# Patient Record
Sex: Female | Born: 2000 | Race: White | Hispanic: No | Marital: Single | Smoking: Never smoker
Health system: Southern US, Community
[De-identification: ages and names within clinical notes are randomized; demographics above are authoritative.]

## PROBLEM LIST (undated history)

## (undated) VITALS — BP 110/65 | HR 134 | Temp 97.7°F | Resp 16 | Ht 64.57 in | Wt 219.4 lb

## (undated) DIAGNOSIS — E669 Obesity, unspecified: Secondary | ICD-10-CM

## (undated) DIAGNOSIS — Z9049 Acquired absence of other specified parts of digestive tract: Secondary | ICD-10-CM

## (undated) DIAGNOSIS — T7840XA Allergy, unspecified, initial encounter: Secondary | ICD-10-CM

## (undated) DIAGNOSIS — F419 Anxiety disorder, unspecified: Secondary | ICD-10-CM

## (undated) DIAGNOSIS — J45909 Unspecified asthma, uncomplicated: Secondary | ICD-10-CM

## (undated) DIAGNOSIS — R519 Headache, unspecified: Secondary | ICD-10-CM

## (undated) DIAGNOSIS — R51 Headache: Secondary | ICD-10-CM

## (undated) DIAGNOSIS — F909 Attention-deficit hyperactivity disorder, unspecified type: Secondary | ICD-10-CM

## (undated) HISTORY — DX: Acquired absence of other specified parts of digestive tract: Z90.49

## (undated) HISTORY — PX: TONSILLECTOMY: SUR1361

## (undated) HISTORY — PX: ADENOIDECTOMY: SUR15

---
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
## 2019-01-07 ENCOUNTER — Emergency Department (HOSPITAL_BASED_OUTPATIENT_CLINIC_OR_DEPARTMENT_OTHER): Payer: BLUE CROSS/BLUE SHIELD

## 2019-01-07 ENCOUNTER — Encounter (HOSPITAL_BASED_OUTPATIENT_CLINIC_OR_DEPARTMENT_OTHER): Payer: Self-pay | Admitting: Adult Health

## 2019-01-07 ENCOUNTER — Other Ambulatory Visit: Payer: Self-pay

## 2019-01-07 ENCOUNTER — Emergency Department (HOSPITAL_BASED_OUTPATIENT_CLINIC_OR_DEPARTMENT_OTHER)
Admission: EM | Admit: 2019-01-07 | Discharge: 2019-01-08 | Disposition: A | Discharge status: Home / Self Care | Payer: BLUE CROSS/BLUE SHIELD | Attending: Emergency Medicine | Admitting: Emergency Medicine

## 2019-01-07 DIAGNOSIS — R110 Nausea: Secondary | ICD-10-CM | POA: Diagnosis not present

## 2019-01-07 DIAGNOSIS — J45901 Unspecified asthma with (acute) exacerbation: Secondary | ICD-10-CM | POA: Insufficient documentation

## 2019-01-07 DIAGNOSIS — R0602 Shortness of breath: Secondary | ICD-10-CM | POA: Diagnosis present

## 2019-01-07 DIAGNOSIS — R0789 Other chest pain: Secondary | ICD-10-CM | POA: Insufficient documentation

## 2019-01-07 LAB — RAPID URINE DRUG SCREEN, HOSP PERFORMED
Amphetamines: NOT DETECTED
Barbiturates: NOT DETECTED
Benzodiazepines: POSITIVE — AB
Cocaine: NOT DETECTED
Opiates: NOT DETECTED
Tetrahydrocannabinol: NOT DETECTED

## 2019-01-07 LAB — BASIC METABOLIC PANEL
Anion gap: 11 (ref 5–15)
BUN: 10 mg/dL (ref 6–20)
CO2: 23 mmol/L (ref 22–32)
Calcium: 10 mg/dL (ref 8.9–10.3)
Chloride: 105 mmol/L (ref 98–111)
Creatinine, Ser: 0.66 mg/dL (ref 0.44–1.00)
GFR calc Af Amer: >60 mL/min (ref >60)
GFR calc non Af Amer: >60 mL/min (ref >60)
Glucose, Bld: 106 mg/dL — ABNORMAL HIGH (ref 70–99)
Potassium: 4 mmol/L (ref 3.5–5.1)
Sodium: 139 mmol/L (ref 135–145)

## 2019-01-07 LAB — CBC
HCT: 44.2 % (ref 36.0–46.0)
Hemoglobin: 13.9 g/dL (ref 12.0–15.0)
MCH: 28 pg (ref 26.0–34.0)
MCHC: 31.4 g/dL (ref 30.0–36.0)
MCV: 89.1 fL (ref 80.0–100.0)
Platelets: 407 10*3/uL — ABNORMAL HIGH (ref 150–400)
RBC: 4.96 MIL/uL (ref 3.87–5.11)
RDW: 13.6 % (ref 11.5–15.5)
WBC: 12.3 10*3/uL — ABNORMAL HIGH (ref 4.0–10.5)
nRBC: 0 % (ref 0.0–0.2)

## 2019-01-07 LAB — TROPONIN I: Troponin I: <0.03 ng/mL (ref <0.03)

## 2019-01-07 LAB — D-DIMER, QUANTITATIVE: D-Dimer, Quant: <0.27 ug/mL-FEU (ref 0.00–0.50)

## 2019-01-07 LAB — PREGNANCY, URINE: Preg Test, Ur: NEGATIVE

## 2019-01-07 MED ORDER — FENTANYL CITRATE (PF) 100 MCG/2ML IJ SOLN
100.0000 ug | Freq: Once | INTRAMUSCULAR | Status: AC
  Administered 2019-01-07: 100 ug via INTRAVENOUS
  Filled 2019-01-07: qty 2

## 2019-01-07 MED ORDER — SODIUM CHLORIDE 0.9% FLUSH
3.0000 mL | Freq: Once | INTRAVENOUS | Status: DC
  Filled 2019-01-07: qty 3

## 2019-01-07 MED ORDER — KETOROLAC TROMETHAMINE 30 MG/ML IJ SOLN
30.0000 mg | Freq: Once | INTRAMUSCULAR | Status: AC
  Administered 2019-01-07: 30 mg via INTRAVENOUS
  Filled 2019-01-07: qty 1

## 2019-01-07 MED ORDER — METHYLPREDNISOLONE SODIUM SUCC 125 MG IJ SOLR
125.0000 mg | Freq: Once | INTRAMUSCULAR | Status: AC
  Administered 2019-01-07: 125 mg via INTRAVENOUS
  Filled 2019-01-07: qty 2

## 2019-01-07 MED ORDER — ONDANSETRON HCL 4 MG/2ML IJ SOLN
4.0000 mg | Freq: Once | INTRAMUSCULAR | Status: AC
  Administered 2019-01-07: 4 mg via INTRAVENOUS
  Filled 2019-01-07: qty 2

## 2019-01-07 MED ORDER — SODIUM CHLORIDE 0.9 % IV BOLUS
1000.0000 mL | Freq: Once | INTRAVENOUS | Status: AC
  Administered 2019-01-07: 21:00:00 1000 mL via INTRAVENOUS

## 2019-01-07 NOTE — ED Provider Notes (Signed)
[facility] EMERGENCY DEPARTMENT Provider Note   CSN: [id number] Arrival date & time: 01/07/19  1906    History   Chief Complaint Chief Complaint  Patient presents with   Chest Pain    HPI Tami Hunter is a 18 y.o. female past medical history of ADHD, anxiety, asthma, obesity who presents for evaluation of 1 week of progressively worsening shortness of breath and 3 to 4 days of chest pain.  Patient reports that she started noticing some progressively worsening shortness of breath states she felt like she was wheezing.  She called her pulmonologist who she follows for asthma who wrote her a course of prednisone.  Patient reports he has been taking the prednisone but with no improvement in symptoms.  She states she feels like she is still wheezing.  She notes that her shortness of breath has gotten worse with exertion.  She notes that today, while walking around [place], she had to stop because she was getting short of breath.  She has been using her inhalers with minimal improvement.  Patient also reports she has had 3 to 4 days of intermittent chest pain.  She describes it as a "sharp tingling type pain" in the midsternal region.  She states that it is slightly worsened with deep inspiration with exertion.  She has not had any associated diaphoresis but has had some nausea.  Patient states she is also been coughing more significantly.  She states she has not noted any production of cough and has not had any hemoptysis.  Patient states that she has not noted any fever.  She has not had any sick contacts or known COVID-19 exposure.  She denies any travel.  Patient states she is not a current smoker.  She denies any cocaine use.  Patient states that her mom had a history of blood clots but was unsure what the reason was.  Patient denies any personal history of blood clots. She denies any OCP use, recent immobilization, prior history of DVT/PE, recent surgery, leg swelling, or long travel.   Patient reports her grandmother had a heart attack at age 84.  No history of high blood pressure or diabetes.  She states she does not eat energy drinks but she has been having more caffeine.     The history is provided by the patient.    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   Family history of Crohn's disease 10/03/2015   Chronic constipation 10/03/2015   Dysmenorrhea 07/05/2015   S/P cholecystectomy 06/02/2015   Obesity 06/02/2015   History of cholecystectomy 06/02/2015   Insomnia 05/31/2015   Bipolar I disorder with depression (HCC) 10/26/2014   Bipolar 1 disorder, depressed, partial remission (HCC) 06/05/2014   Chronic post-traumatic stress disorder (PTSD) 06/05/2014   Attention deficit hyperactivity disorder (ADHD), combined type, moderate 06/05/2014   Adjustment disorder with mixed anxiety and depressed mood 01/29/2013    Past Surgical History:  Procedure Laterality Date   ADENOIDECTOMY  TONSILLECTOMY       OB History   No obstetric history on file.      Home Medications    Prior to Admission medications   Medication Sig Start Date End Date Taking? Authorizing Provider  albuterol (PROVENTIL HFA;VENTOLIN HFA) 108 (90 BASE) MCG/ACT inhaler Inhale 2 puffs into the lungs every 6 (six) hours as needed for wheezing or shortness of breath. 10/05/14   Chauncey Mann, MD  aspirin-acetaminophen-caffeine (EXCEDRIN MIGRAINE) [phone] MG per tablet Take 2 tablets by mouth every 6 (six) hours as needed  for headache or migraine (mild pain). Patient not taking: Reported on 05/31/2015 10/05/14   Chauncey Mann, MD  buPROPion (WELLBUTRIN XL) 300 MG 24 hr tablet Take 1 tablet (300 mg total) by mouth daily. 01/31/16   Court Joy, PA-C  cloNIDine (CATAPRES) 0.1 MG tablet Take 1-2 tablets at bed time 01/23/16   Court Joy, PA-C  dapsone 25 MG tablet TAKE 1 TABLET BY MOUTH EVERY DAY FOR 2 WEEKS,THEN INCREASE TO 4 TABLETS DAILY 02/21/16   [provider]  dicyclomine (BENTYL) 20 MG tablet Take 20 mg by mouth. 05/02/15 01/31/16  [provider]  diphenoxylate-atropine (LOMOTIL) 2.5-0.025 MG tablet TAKE 1 TO 2 TABLETS BY MOUTH EVERY 4 TO 6 HOURS AS NEEDED CRAMPY ABDOMINAL PAIN OR DIARRHEA 05/08/15   [provider]  doxycycline (VIBRA-TABS) 100 MG tablet Take 1 tablet (100 mg total) by mouth 2 (two) times daily. Patient not taking: Reported on 01/31/2016 10/05/14   Chauncey Mann, MD  FLUoxetine (PROZAC) 20 MG capsule Take 1 capsule (20 mg total) by mouth every morning. 01/23/16   Court Joy, PA-C  fluticasone Aleda Grana) 50 MCG/ACT nasal spray  10/19/14   [provider]  lamoTRIgine (LAMICTAL) 100 MG tablet  02/20/16   [provider]  levonorgestrel-ethinyl estradiol (SRONYX) 0.1-20 MG-MCG tablet Take by mouth. 07/05/15 07/04/16  [provider]  lithium carbonate (LITHOBID) 300 MG CR tablet TAKE ONE EACH MORNING AND WITH DINNER, TAKE TWO AT BEDTIME. 01/31/16   Court Joy, PA-C  Meclizine HCl 25 MG CHEW 1-2 tabs TID prn vertigo 01/03/16   Court Joy, PA-C  metroNIDAZOLE (FLAGYL) 500 MG tablet Reported on 01/03/2016 06/28/15   [provider]  NIFEdipine (PROCARDIA-XL/ADALAT-CC/NIFEDICAL-XL) 30 MG 24 hr tablet TAKE 1 TABLET (30 MG TOTAL) BY MOUTH DAILY. 12/31/15   [provider]  omeprazole (PRILOSEC) 40 MG capsule  02/20/16   [provider]  ondansetron (ZOFRAN) 8 MG tablet TAKE 1 TABLET (8 MG TOTAL) BY MOUTH EVERY  8 (EIGHT) HOURS AS NEEDED FOR UP TO 30 DAYS FOR NAUSEA. 03/13/16   [provider]  traZODone (DESYREL) 100 MG tablet Take 1 tablet (100 mg total) by mouth at bedtime. 01/31/16   Court Joy, PA-C  tretinoin (RETIN-A) 0.025 % cream  08/24/14   [provider]  Vitamin D, Ergocalciferol, (DRISDOL) 50000 UNITS CAPS capsule Reported on 01/03/2016 10/19/14   [provider]    Family History Family History  Problem Relation Age of Onset   Bipolar disorder Father    Bipolar disorder Maternal Aunt    Alcohol abuse Paternal Grandmother    Drug abuse Paternal Grandmother    Crohn's disease Mother    Colitis Mother     Social History Social History   Tobacco Use   Smoking status: Never Smoker   Smokeless tobacco: Never Used  Substance Use Topics   Alcohol use: No    Alcohol/week:  0.0 standard drinks   Drug use: No     Allergies   Clindamycin; Penicillins; Cephalexin; Other; Codeine; Iodinated diagnostic agents; and Ioxaglate   Review of Systems Review of Systems  Constitutional: Negative for fever.  Respiratory: Positive for shortness of breath. Negative for cough.   Cardiovascular: Positive for chest pain. Negative for leg swelling.  Gastrointestinal: Negative for abdominal pain, nausea and vomiting.  Genitourinary: Negative for dysuria and hematuria.  Neurological: Negative for headaches.  All other systems reviewed and are negative.    Physical Exam Updated Vital Signs BP 140/84 (BP Location: Left Arm)    Pulse 91    Temp 98.2 F (36.8 C)    Resp (!) 22    Ht  (1.676 m)    Wt 135.6 kg    SpO2 99%    BMI 48.26 kg/m   Physical Exam Vitals signs and nursing note reviewed.  Constitutional:      Appearance: Normal appearance. She is well-developed.     Comments: Appears anxious  HENT:     Head: Normocephalic and atraumatic.  Eyes:     General: Lids are normal.     Conjunctiva/sclera: Conjunctivae normal.     Pupils: Pupils  are equal, round, and reactive to light.  Neck:     Musculoskeletal: Full passive range of motion without pain.  Cardiovascular:     Rate and Rhythm: Normal rate and regular rhythm.     Pulses: Normal pulses.          Radial pulses are 2+ on the right side and 2+ on the left side.       Dorsalis pedis pulses are 2+ on the right side and 2+ on the left side.     Heart sounds: Normal heart sounds. No murmur. No friction rub. No gallop.   Pulmonary:     Effort: Pulmonary effort is normal.     Breath sounds: Normal breath sounds.     Comments: Lungs clear to auscultation bilaterally.  Symmetric chest rise.  No wheezing, rales, rhonchi.  Able to speak in full sentences without any difficulty. Chest:     Comments: Pain reproduced by palpation of midsternal chest. Abdominal:     Palpations: Abdomen is soft. Abdomen is not rigid.     Tenderness: There is no abdominal tenderness. There is no guarding.     Comments: Lungs clear to auscultation bilaterally.  Symmetric chest rise.  No wheezing, rales, rhonchi.  Musculoskeletal: Normal range of motion.     Comments: BLE are symmetric in appearance without any overlying warmth, erythema, edema.   Skin:    General: Skin is warm and dry.     Capillary Refill: Capillary refill takes less than 2 seconds.  Neurological:     Mental Status: She is alert and oriented to person, place, and time.  Psychiatric:        Speech: Speech normal.      ED Treatments / Results  Labs (all labs ordered are listed, but only abnormal results are displayed) Labs Reviewed  BASIC METABOLIC PANEL - Abnormal; Notable for the following components:      Result Value   Glucose, Bld 106 (*)    All other components within normal limits  CBC - Abnormal; Notable for the following components:   WBC 12.3 (*)    Platelets 407 (*)    All other components within normal limits  RAPID URINE DRUG SCREEN, HOSP PERFORMED - Abnormal; Notable for the following components:  Benzodiazepines POSITIVE (*)    All other components within normal limits  TROPONIN I  PREGNANCY, URINE  D-DIMER, QUANTITATIVE (NOT AT [facility])  TROPONIN I    EKG EKG Interpretation  Date/Time:  Friday Jan 07 2019 19:15:44 EDT Ventricular Rate:  116 PR Interval:  140 QRS Duration: 90 QT Interval:  308 QTC Calculation: 428 R Axis:   103 Text Interpretation:  Sinus tachycardia Rightward axis Cannot rule out Inferior infarct , age undetermined Abnormal ECG inferior T wave inversions and percordial T wave flattening new from previous Confirmed by Frederick Peers [phone]) on 01/07/2019 7:24:03 PM Also confirmed by Frederick Peers [phone]), editor Barbette Hair [phone])  on 01/08/2019 7:03:48 AM   Radiology Dg Chest 2 View  Result Date: 01/07/2019 CLINICAL DATA:  Chest tightness. EXAM: CHEST - 2 VIEW COMPARISON:  07/06/2018 FINDINGS: Mild peribronchial thickening.The cardiomediastinal contours are normal. Pulmonary vasculature is normal. No consolidation, pleural effusion, or pneumothorax. No acute osseous abnormalities are seen. EKG leads project over the chest. IMPRESSION: Mild peribronchial thickening suggestive of bronchitis or asthma. Electronically Signed   By: Narda Rutherford M.D.   On: 01/07/2019 20:15    Procedures Procedures (including critical care time)  Medications Ordered in ED Medications  sodium chloride 0.9 % bolus 1,000 mL (0 mLs Intravenous Stopped 01/07/19 2338)  methylPREDNISolone sodium succinate (SOLU-MEDROL) 125 mg/2 mL injection 125 mg (125 mg Intravenous Given 01/07/19 2111)  fentaNYL (SUBLIMAZE) injection 100 mcg (100 mcg Intravenous Given 01/07/19 2142)  ondansetron (ZOFRAN) injection 4 mg (4 mg Intravenous Given 01/07/19 2141)  ketorolac (TORADOL) 30 MG/ML injection 30 mg (30 mg Intravenous Given 01/07/19 2210)     Initial Impression / Assessment and Plan / ED Course  I have reviewed the triage vital signs and the nursing notes.  Pertinent labs & imaging results  that were available during my care of the patient were reviewed by me and considered in my medical decision making (see chart for details).        18 year old female who presents for evaluation of chest pain and shortness of breath.  Reports shortness breath been ongoing for about a week.  Called pulmonologist who prescribed her prednisone.  Reports about 3 to 4 days of chest pain.  On initial ED arrival, she is afebrile but is tachycardic.  She does appear very anxious on exam.  Vitals otherwise stable.  She is not hypoxic.  Consider asthma exacerbation versus infectious etiology.  Also consider ACS etiology though low suspicion.  Low suspicion for PE but given tachycardia cannot PERC patient out.  Will plan for labs, chest x-ray, EKG.  History/physical exam not concerning for aortic dissection, unstable angina.  At this time, patient does not meet criteria for COVID-19 testing.  BMP is unremarkable.  CBC shows slight leukocytosis of 12.3.  Patient has been on oral course of prednisone for her asthma which I suspect is contributing to her elevated white blood cell count.  Troponin negative.  D-dimer negative.  Urine pregnancy negative.  Chest x-ray shows mild peribronchial thickening.  No evidence of infiltrate.  Given patient's history/risk factors, she does have a Heart score of 2.  Given new findings on EKG, will plan to delta troponin.  Reevaluation after inhaler treatments, steroids here in the emergency department.  Patient reports that her chest pain has improved significantly.  Also reports improving breathing.  Repeat lung exams unremarkable.  Delta troponin negative.  Discussed results with patient.  Vital signs are stable.  Tachycardia has improved with fluids and  pain medication here in the ED.  At this time, her exam is not concerning for unstable angina, yes etiology.  Suspect asthma exacerbation.  She is already on prednisone.  Encouraged her to continue prednisone and follow-up with  her pulmonologist. At this time, patient exhibits no emergent life-threatening condition that require further evaluation in ED or admission. Patient had ample opportunity for questions and discussion. All patient's questions were answered with full understanding. Strict return precautions discussed. Patient expresses understanding and agreement to plan.   Portions of this note were generated with Scientist, clinical (histocompatibility and immunogenetics)Dragon dictation software. Dictation errors may occur despite best attempts at proofreading.    Final Clinical Impressions(s) / ED Diagnoses   Final diagnoses:  Exacerbation of asthma, unspecified asthma severity, unspecified whether persistent  Atypical chest pain    ED Discharge Orders    None       Rosana HoesLayden, Graceanne Guin A, PA-C 01/08/19 2307    Little, Ambrose Finlandachel Morgan, MD 01/12/19 1950

## 2019-01-07 NOTE — ED Triage Notes (Addendum)
Presents with Chest tightness that began today that began after walking associated with left jaw pain and pain with breathing. When she arrived to the ED, her initial HR was 140-150, as we got the EKG set up and talked with her her rate came down to 120s. She reports that she has a severe case of Asthma and sees a pulmonologist as well as takes her asthma medications. She used her rescue inhaler today and has not felt relief. Sats 100% RA. She was diaphoretic when she first arrived. SAts 100% Ra, she is speaking in full sentences. Walking makes Chest pain and tightness worse. She reports fatigue and SOB.

## 2019-01-08 LAB — TROPONIN I: Troponin I: <0.03 ng/mL (ref <0.03)

## 2019-01-08 NOTE — Discharge Instructions (Addendum)
Continue taking prednisone as directed by your pulmonologist.  Touch base with your pulmonologist and let them know you are here in the emergency department.  Return to the emergency department for any worsening difficulty breathing, chest pain or any other worsening or concerning symptoms.

## 2020-10-22 ENCOUNTER — Other Ambulatory Visit: Payer: Self-pay | Admitting: Orthopedic Surgery

## 2020-10-22 DIAGNOSIS — S83249A Other tear of medial meniscus, current injury, unspecified knee, initial encounter: Secondary | ICD-10-CM

## 2020-10-22 DIAGNOSIS — S83289A Other tear of lateral meniscus, current injury, unspecified knee, initial encounter: Secondary | ICD-10-CM

## 2020-11-12 ENCOUNTER — Other Ambulatory Visit: Payer: Self-pay

## 2020-11-12 ENCOUNTER — Ambulatory Visit
Admission: RE | Admit: 2020-11-12 | Discharge: 2020-11-12 | Disposition: A | Discharge status: Home / Self Care | Payer: BLUE CROSS/BLUE SHIELD | Source: Ambulatory Visit | Attending: Orthopedic Surgery | Admitting: Orthopedic Surgery

## 2020-11-12 DIAGNOSIS — S83289A Other tear of lateral meniscus, current injury, unspecified knee, initial encounter: Secondary | ICD-10-CM

## 2020-11-12 DIAGNOSIS — S83249A Other tear of medial meniscus, current injury, unspecified knee, initial encounter: Secondary | ICD-10-CM

## 2022-10-10 IMAGING — MR MR KNEE*L* W/O CM
4 of 6 series · 24 of 40 positions shown · non-contrast
Comparison: Plain films left knee 10/08/2020.

CLINICAL DATA: Left knee pain and swelling for approximately 4
months since a twisting injury. Subsequent encounter.

EXAM:
MRI OF THE LEFT KNEE WITHOUT CONTRAST
TECHNIQUE: Multiplanar, multisequence MR imaging of the knee was performed. No
intravenous contrast was administered.

[Series 4: T2 fat-sat · coronal · 4.0mm · 0.70mm/px · 7 of 29 slices shown (1 of 2)]
[im 1/29]
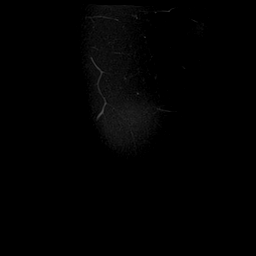
[im 5/29]
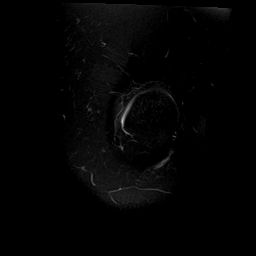
[im 10/29]
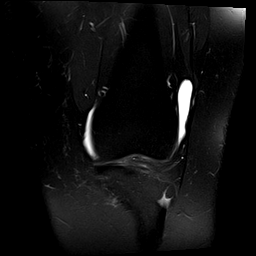
[im 15/29]
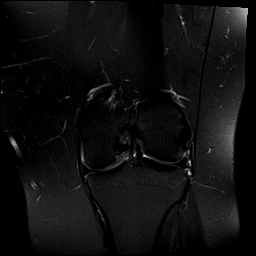
[im 19/29]
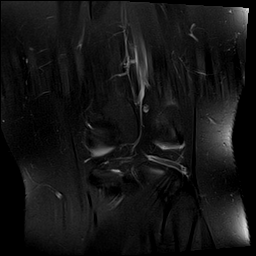
[im 24/29]
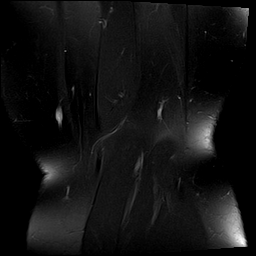
[im 29/29]
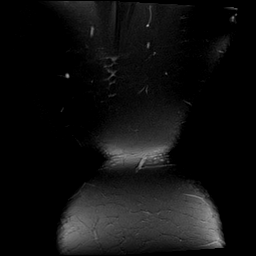

[Series 5: T1 · coronal · 4.0mm · 0.35mm/px · 2 of 8 slices shown]
[im 1/8]
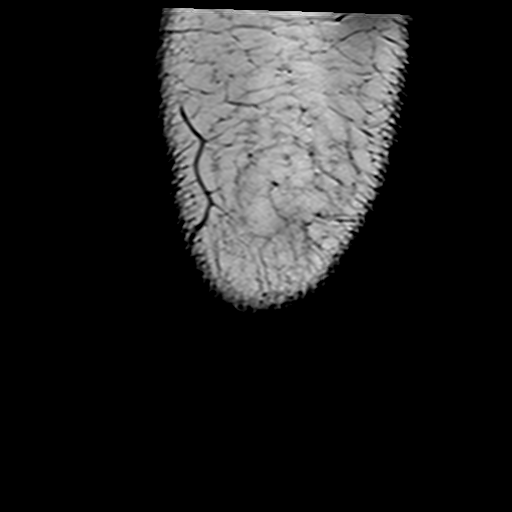
[im 8/8]
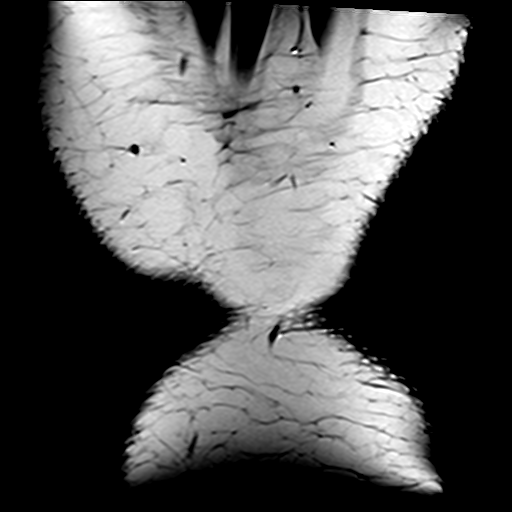

[Series 7: PD fat-sat · sagittal · 3.0mm · 0.35mm/px · 8 of 32 slices shown]
[im 1/32]
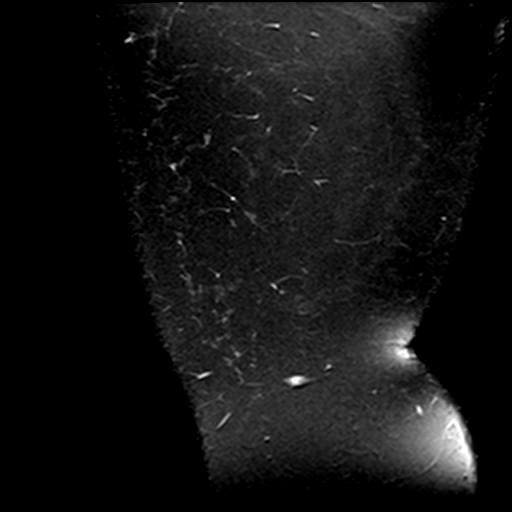
[im 5/32]
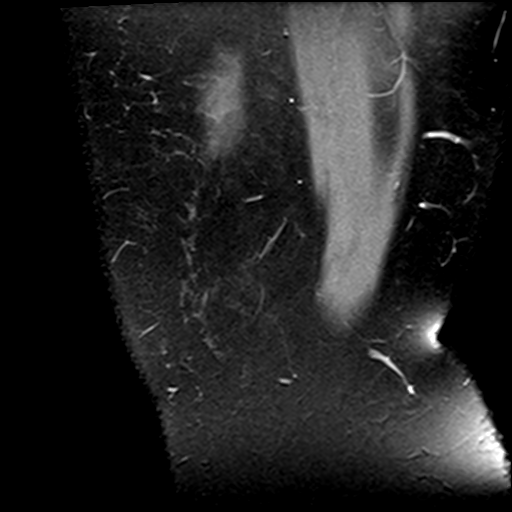
[im 9/32]
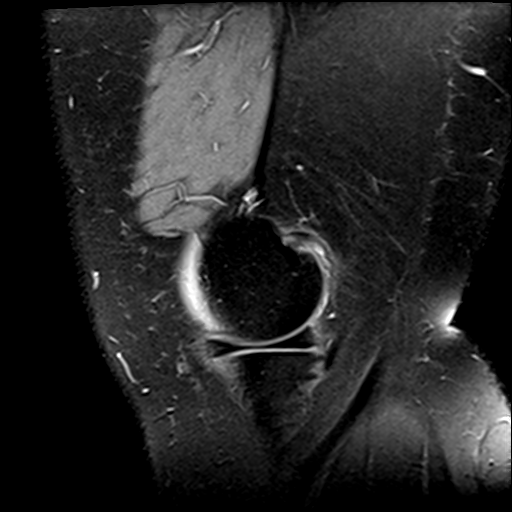
[im 14/32]
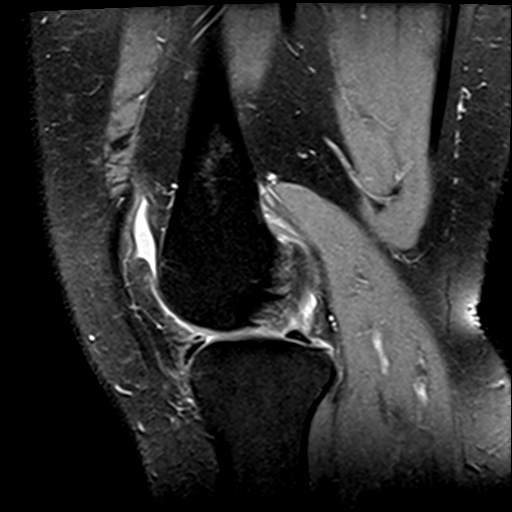
[im 18/32]
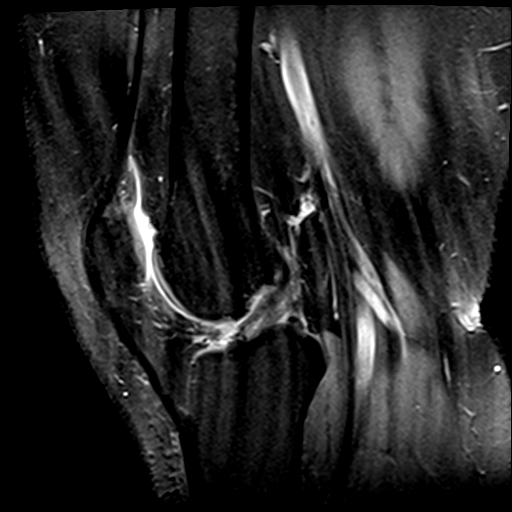
[im 23/32]
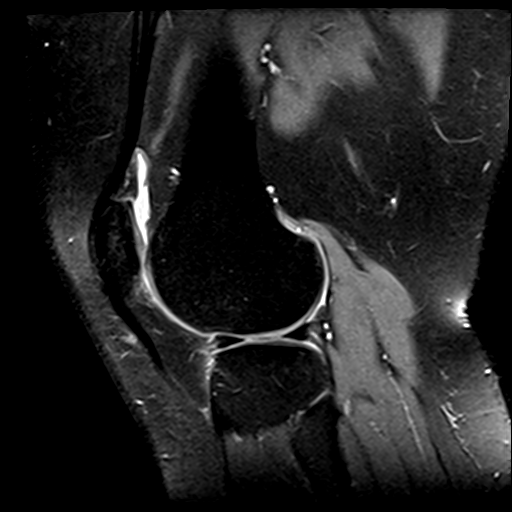
[im 27/32]
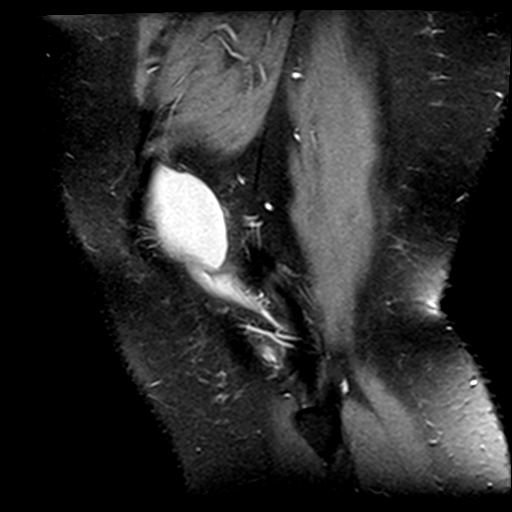
[im 32/32]
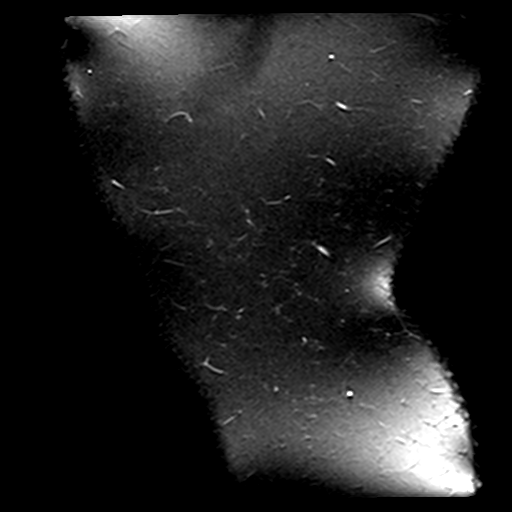

[Series 8: T2 fat-sat · sagittal · 3.0mm · 0.31mm/px · 7 of 32 slices shown (2 of 2)]
[im 1/32]
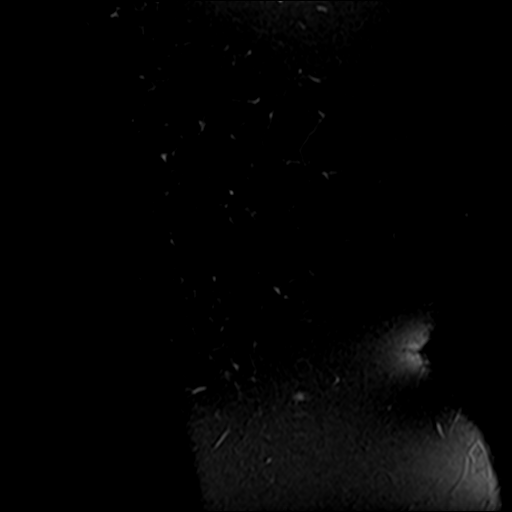
[im 5/32]
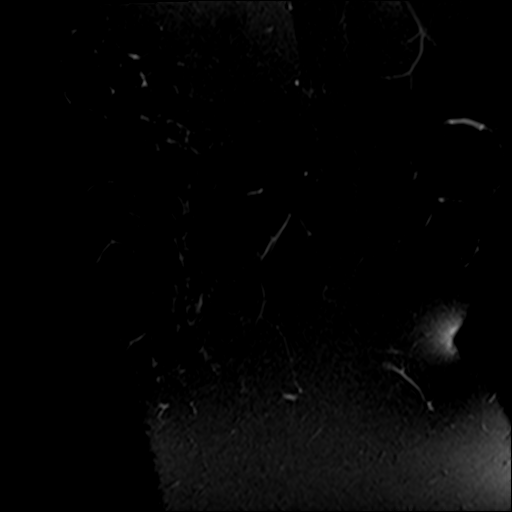
[im 9/32]
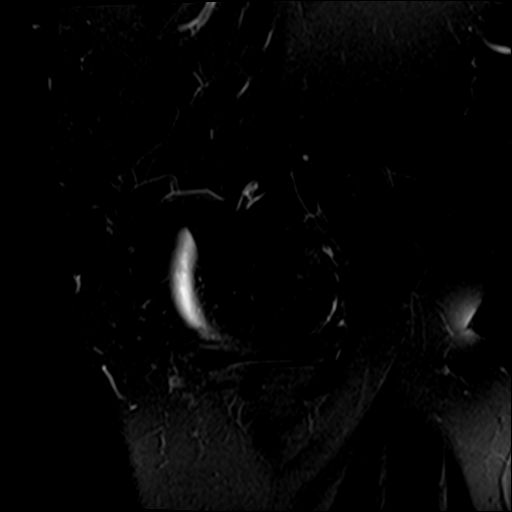
[im 14/32]
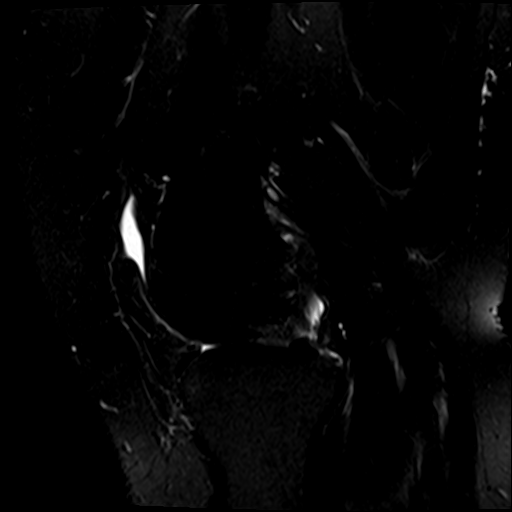
[im 18/32]
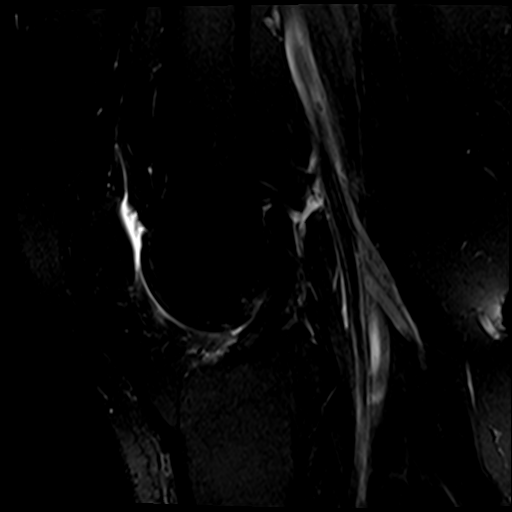
[im 23/32]
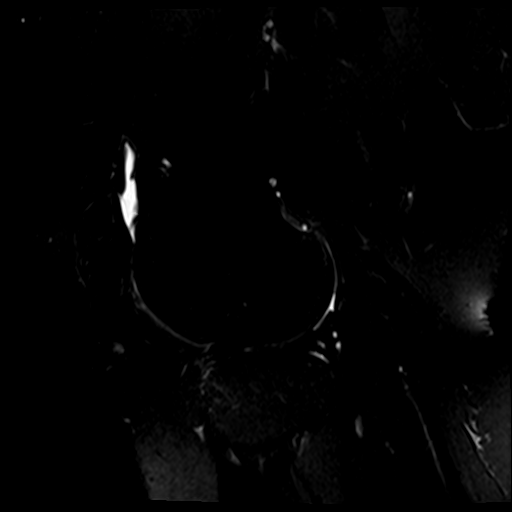
[im 27/32]
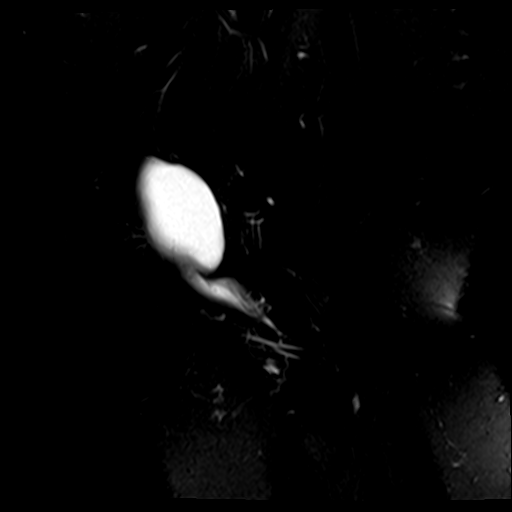

[24 of 40 positions shown; findings below may reference images not displayed]

FINDINGS: MENISCI

Medial meniscus:  Intact.

Lateral meniscus:  Intact.

LIGAMENTS

Cruciates:  Intact.

Collaterals:  Intact.

CARTILAGE

Patellofemoral:  Preserved.

Medial:  Mildly degenerated.

Lateral:  Mildly degenerated.

Joint:  Small joint effusion.

Popliteal Fossa:  No Baker's cyst.

Extensor Mechanism:  Intact.

Bones: No fracture, stress change or worrisome lesion. Minimal
osteophytosis is seen about the knee.

Other: None.
IMPRESSION: Negative for meniscal or ligament tear.

Very mild medial and lateral compartment degenerative change.
